# Patient Record
Sex: Female | Born: 1950 | Race: White | Hispanic: No | Marital: Married | Smoking: Never smoker
Health system: Southern US, Community
[De-identification: ages and names within clinical notes are randomized; demographics above are authoritative.]

## PROBLEM LIST (undated history)

## (undated) DIAGNOSIS — G8929 Other chronic pain: Secondary | ICD-10-CM

## (undated) DIAGNOSIS — M542 Cervicalgia: Secondary | ICD-10-CM

## (undated) DIAGNOSIS — M549 Dorsalgia, unspecified: Secondary | ICD-10-CM

## (undated) DIAGNOSIS — M797 Fibromyalgia: Secondary | ICD-10-CM

## (undated) HISTORY — DX: Cervicalgia: M54.2

## (undated) HISTORY — DX: Fibromyalgia: M79.7

## (undated) HISTORY — PX: OTHER SURGICAL HISTORY: SHX169

## (undated) HISTORY — PX: SHOULDER SURGERY: SHX246

## (undated) HISTORY — DX: Other chronic pain: G89.29

## (undated) HISTORY — PX: BACK SURGERY: SHX140

## (undated) HISTORY — DX: Dorsalgia, unspecified: M54.9

## (undated) HISTORY — PX: KNEE SURGERY: SHX244

---

## 1998-02-28 ENCOUNTER — Ambulatory Visit (HOSPITAL_COMMUNITY): Admission: RE | Admit: 1998-02-28 | Discharge: 1998-02-28 | Payer: Self-pay | Admitting: Neurosurgery

## 1998-03-31 ENCOUNTER — Encounter: Admission: RE | Admit: 1998-03-31 | Discharge: 1998-06-23 | Payer: Self-pay | Admitting: Anesthesiology

## 1998-06-15 ENCOUNTER — Encounter: Admission: RE | Admit: 1998-06-15 | Discharge: 1998-09-13 | Payer: Self-pay | Admitting: Anesthesiology

## 1998-06-23 ENCOUNTER — Encounter: Admission: RE | Admit: 1998-06-23 | Discharge: 1998-08-30 | Payer: Self-pay | Admitting: Anesthesiology

## 1998-08-22 ENCOUNTER — Encounter: Admission: RE | Admit: 1998-08-22 | Discharge: 1998-11-11 | Payer: Self-pay | Admitting: Anesthesiology

## 1998-11-11 ENCOUNTER — Encounter: Admission: RE | Admit: 1998-11-11 | Discharge: 1999-02-09 | Payer: Self-pay | Admitting: Anesthesiology

## 1998-12-06 ENCOUNTER — Other Ambulatory Visit: Admission: RE | Admit: 1998-12-06 | Discharge: 1998-12-06 | Payer: Self-pay | Admitting: Obstetrics and Gynecology

## 1999-02-15 ENCOUNTER — Encounter: Admission: RE | Admit: 1999-02-15 | Discharge: 1999-05-09 | Payer: Self-pay | Admitting: Anesthesiology

## 1999-05-08 ENCOUNTER — Encounter: Admission: RE | Admit: 1999-05-08 | Discharge: 1999-08-07 | Payer: Self-pay | Admitting: Anesthesiology

## 1999-10-02 ENCOUNTER — Encounter: Admission: RE | Admit: 1999-10-02 | Discharge: 1999-12-25 | Payer: Self-pay | Admitting: Anesthesiology

## 1999-12-25 ENCOUNTER — Encounter: Admission: RE | Admit: 1999-12-25 | Discharge: 2000-03-04 | Payer: Self-pay | Admitting: Anesthesiology

## 2000-01-11 ENCOUNTER — Other Ambulatory Visit: Admission: RE | Admit: 2000-01-11 | Discharge: 2000-01-11 | Payer: Self-pay | Admitting: Obstetrics and Gynecology

## 2000-03-18 ENCOUNTER — Encounter: Admission: RE | Admit: 2000-03-18 | Discharge: 2000-06-16 | Payer: Self-pay | Admitting: Anesthesiology

## 2000-07-12 ENCOUNTER — Encounter: Admission: RE | Admit: 2000-07-12 | Discharge: 2000-10-10 | Payer: Self-pay | Admitting: Anesthesiology

## 2000-11-19 ENCOUNTER — Encounter: Admission: RE | Admit: 2000-11-19 | Discharge: 2001-02-17 | Payer: Self-pay | Admitting: Anesthesiology

## 2001-01-27 ENCOUNTER — Other Ambulatory Visit: Admission: RE | Admit: 2001-01-27 | Discharge: 2001-01-27 | Payer: Self-pay | Admitting: Obstetrics and Gynecology

## 2001-02-18 ENCOUNTER — Encounter: Admission: RE | Admit: 2001-02-18 | Discharge: 2001-04-19 | Payer: Self-pay | Admitting: Anesthesiology

## 2001-05-27 ENCOUNTER — Encounter: Payer: Self-pay | Admitting: Gastroenterology

## 2001-05-27 ENCOUNTER — Ambulatory Visit (HOSPITAL_COMMUNITY): Admission: RE | Admit: 2001-05-27 | Discharge: 2001-05-27 | Payer: Self-pay | Admitting: Gastroenterology

## 2002-01-28 ENCOUNTER — Other Ambulatory Visit: Admission: RE | Admit: 2002-01-28 | Discharge: 2002-01-28 | Payer: Self-pay | Admitting: Obstetrics and Gynecology

## 2002-03-20 ENCOUNTER — Encounter (INDEPENDENT_AMBULATORY_CARE_PROVIDER_SITE_OTHER): Payer: Self-pay | Admitting: Specialist

## 2002-03-20 ENCOUNTER — Ambulatory Visit (HOSPITAL_BASED_OUTPATIENT_CLINIC_OR_DEPARTMENT_OTHER): Admission: RE | Admit: 2002-03-20 | Discharge: 2002-03-20 | Payer: Self-pay | Admitting: Urology

## 2003-01-19 ENCOUNTER — Ambulatory Visit (HOSPITAL_COMMUNITY): Admission: RE | Admit: 2003-01-19 | Discharge: 2003-01-19 | Payer: Self-pay | Admitting: Obstetrics and Gynecology

## 2003-01-19 ENCOUNTER — Encounter (INDEPENDENT_AMBULATORY_CARE_PROVIDER_SITE_OTHER): Payer: Self-pay

## 2004-02-03 ENCOUNTER — Other Ambulatory Visit: Admission: RE | Admit: 2004-02-03 | Discharge: 2004-02-03 | Payer: Self-pay | Admitting: Obstetrics and Gynecology

## 2004-02-23 ENCOUNTER — Encounter: Admission: RE | Admit: 2004-02-23 | Discharge: 2004-02-23 | Payer: Self-pay | Admitting: Gastroenterology

## 2004-03-07 ENCOUNTER — Encounter: Admission: RE | Admit: 2004-03-07 | Discharge: 2004-03-07 | Payer: Self-pay | Admitting: Gastroenterology

## 2005-02-07 ENCOUNTER — Other Ambulatory Visit: Admission: RE | Admit: 2005-02-07 | Discharge: 2005-02-07 | Payer: Self-pay | Admitting: Obstetrics and Gynecology

## 2008-01-23 ENCOUNTER — Encounter: Admission: RE | Admit: 2008-01-23 | Discharge: 2008-01-23 | Payer: Self-pay | Admitting: Gastroenterology

## 2008-03-18 ENCOUNTER — Encounter: Payer: Self-pay | Admitting: Gastroenterology

## 2011-01-05 NOTE — H&P (Signed)
[facility]  Patient:    Latoya Herring, Latoya Herring                        MRN: [id number] Adm. Date:  [id number] Attending:  Thyra Breed CC:         Osker Mason, M.D.             Julio Sicks, M.D.                         History and Physical  FOLLOW-UP EVALUATION:  Leaner comes in for follow-up evaluation of her chronic low back pain on the basis of degenerative disk disease and neck pain.   Neck pain and her lower back discomfort seem to have stabilized out nicely on the 40 mg b.i.d. of OxyContin in addition to her Neurontin and Vicodin.  She has not been able to reduce her Vicodin to any significant extent.  She rates her pain at 4 out of 10.  She complains predominantly of left-sided neck, shoulder, and shoulder blade discomfort.  She states it feels as though it is real tight in there.  She has applied heat, and it does help.  She has wrist discomfort bilaterally, which is worse at night.  If she does not sleep in the praying position, she notes that she has a lot of discomfort.  She is very worried about her disability and brings a letter that has been addressed to her with the need to address this.  MEDICATIONS:  Current medications are Neurontin 400 mg one p.o. q.6h., OxyContin 40 mg b.i.d., Accupril 20 mg per day, Paxil 20 mg per day, Mircette one per day, and Vicodin 5/500 one p.o. q.6h. x 3 during the day.  PHYSICAL EXAMINATION:  VITAL SIGNS:  Blood pressure is 141/99, heart rate is 91, respirations 12, O2 saturation is 98%, pain level is 4 out of 10, and temperature is 97.1.  MUSCULOSKELETAL/NEUROLOGIC:  Neck demonstrates good range of motion.  Deep tendon reflexes are symmetric in the upper and lower extremities with negative Spurling sign.  Motor is 5/5.  She has negative Phalens sign and Tinels sign at the wrist.  IMPRESSION: 1. Neck pain with very mild spondylosis on MRI. 2. Low back pain on the basis of degenerative disk disease with  chronic L5    radiculopathy. 3. Hypertension and hypertriglyceridemia per primary care physician. 4. Depression.  DISPOSITION: 1. Continue current medications with a prescription written for OxyContin    40 mg b.i.d., #60 with no refill, and Vicodin #90, one p.o. q.6h. p.r.n.    x 3 during the day. 2. Follow up with me in four to eight weeks. 3. Continue to walk daily. DD:  03/18/00 TD:  03/19/00 Job: [id number]

## 2011-01-05 NOTE — H&P (Signed)
[facility]  Patient:    Latoya Herring, Latoya Herring                        MRN: [id number] Adm. Date:  [id number] Attending:  Thyra Breed CC:         Latoya Herring, M.D.  Osker Mason, M.D.   History and Physical  FOLLOW-UP EVALUATION:  HISTORY OF PRESENT ILLNESS:  Latoya Herring comes in for a followup evaluation of her chronic L5 radiculopathy into the lower extremity and chronic neck discomfort. Since her last evaluation, the patient has continued to have a lot of neck discomfort and her lower extremity discomfort is not improved.  She apparently has had two failed attempts at social security disability and is getting somewhat frustrated by this, and is seriously contemplating getting an attorney.  I encouraged her to go ahead and do what she felt she needed to do to try and get that situated.  She notes that she has pain radiating out to the left shoulder, which is exacerbated by exercising or light work.  She has not been to physical therapy and has not gotten Latoya Herring on exercises for the neck.  EXAMINATION:  Blood pressure 187/84, heart rate is 96, respiratory rate is 18. O2 saturation is 99%.  Pain level is 5/10.  She exhibits pain on rotation of her neck to the left to about 45 degrees.  Deep tendon reflexes were symmetric in the upper and lower extremities.  Motor was 5/5.  IMPRESSION: 1. Neck pain, suspected soft tissue, myofacial pain type situation versus    shoulder problems, versus less likely cervical pathology within the joints    as magnetic resonance imaging has not been suggestive of this. 2. Low-back pain with underlying degenerative disk disease and chronic L5    radiculopathy, which continues to limit her activities significantly. 3. Other medical problems per Dr. Latricia Herring, her primary care physician.  DISPOSITION: 1. Continue Neurontin at 600 mg q.6h.  She does not need a prescription    for this today. 2. OxyContin 40 mg one p.o.  b.i.d., #60 with no refill. 3. Vicodin 5/500 one p.o. q.6h. p.r.n. breakthrough pain, #90 with no refill. 4. She was encouraged to go ahead and get Dr. Beverely Herring book on the    neck. 5. Followup with me in three months.  She will let us know when she needs    prescriptions. DD:  08/21/00 TD:  08/21/00 Job: [id number]

## 2011-01-05 NOTE — H&P (Signed)
[facility]  Patient:    Latoya Herring, Latoya Herring                        MRN: [id number] Adm. Date:  [id number] Attending:  Thyra Breed CC:         ____________, Wyn Forster, [place]                         History and Physical  FOLLOWUP EVALUATION:  Jyssica comes in for followup evaluation of her neck pain and lower back pain.  Since her last evaluation, the patient has noted that her neck discomfort has been the worst.  She has a chronic pain in the left side of her neck and with certain movements, she will get the pain out into the right side.  It comes and goes and is exacerbated by movement from side-to-side.  She does not feel that a lot helps her neck.  Her lower back discomfort is most intensive if she stays in one position for any period of time.  If she sits for more than 10 minutes, stands for any longer than 5 to 10 minutes or lies in any position except on her right side, she has progressively intensifying severe pain which limits her.  She has tried to exercise and walk as much as she can but she can only do light housework.  CURRENT MEDICATIONS: 1. Vicodin, which she takes three times a day. 2. OxyContin 40 mg twice a day. 3. Neurontin 600 mg q.6h. 4. Accupril. 5. She has also taken some Vioxx which she notes helps.  She has developed some abdominal discomfort which she states is reminiscent of when she has exacerbations of her diverticulitis and she has seen Dr. Petra Kuba in the past for this and I encouraged her to consider going ahead and having him reevaluate her if she is having problems.  She saw her primary care physician, ______  in [place], very recently.  He apparently checked her thyroid and she does not know the results of these evaluations.  PHYSICAL EXAMINATION:  VITAL SIGNS:  Blood pressure 137/91.  Heart rate is 94.  Respiratory rate is 18.  O2 saturation is 99%.  Pain level is 4/10.  NECK:  She demonstrates pain on range of  motion of her neck.  NEUROLOGIC:  Deep tendon reflexes were symmetric in the upper and lower extremities.  She has increased pain of her lower back on hyperextension to about 20 degrees.  IMPRESSION: 1. Low back pain on the basis of degenerative disk disease with chronic L5    radiculopathy. 2. Neck pain, predominantly myofascial in etiology.  I suspect she has early    degenerative changes, but these were not evident on her last MRI. 3. Abdominal discomfort for which she plans to see Dr. Ewing Schlein. 4. Other medical problems per ______  and ______ , her primary care    physicians.  DISPOSITION: 1. Continue on Neurontin at current dose. 2. OxyContin 40 mg 1 p.o. b.i.d., #60 with no refill. 3. Vicodin 5/500, 1 p.o. q.6h. p.r.n. x 3 during the day, #90 with no refill. 4. Continue to exercise. 5. Follow up with me in three months. DD:  11/20/00 TD:  11/20/00 Job: [id number]

## 2011-01-05 NOTE — H&P (Signed)
[facility]  Patient:    Latoya Herring, Latoya Herring                        MRN: [id number] Adm. Date:  [id number] Attending:  Thyra Breed CC:         Dr. Latricia Heft, [place], [place]  Dr. Julio Sicks   History and Physical  FOLLOWUP EVALUATION:  Latoya Herring comes in for followup evaluation of her chronic low back pain on the basis of spondylosis.  Since her last evaluation, she states that she has been very tolerant to that discomfort.  Her biggest discomfort is neck pain, radiating out into the left upper extremity and shoulder.  At times when she turns her head, she has a lightning, electrical-type discomfort radiating into the occipital region and out into the top of the shoulder but does not go out into the arm.  She has noted that the pain is so severe that it keeps her from sleeping well at night.  It is more a deep, dull, achy discomfort.  She notes that certain positions tend to exacerbate it and she is struggling to find something that reduces the discomfort.  We have done MRIs which showed minimal changes in her neck in the past.  I advised her that it may be more of a shoulder or soft tissue-type problem and we needed to consider physical therapy.  She is concerned that her insurance may not cover physical therapy.  She continues on OxyContin, Neurontin and Vicodin.  EXAMINATION  VITAL SIGNS:  Blood pressure 138/90, heart rate is 91, respiratory rate is 20, O2 saturation is 99% and pain level is 6/10.  NEUROMUSCULAR:  Range of motion of her neck is intact with some tenderness over the left occipital region and left paraspinous muscles.  Range of motion of her shoulders was intact but resisted internal/external rotation increased discomfort in her shoulder.  Motor in the upper extremities was 5/5. Sensation to pinprick was intact.  Deep tendon reflexes were symmetric.  Lower extremities demonstrated symmetric deep tendon reflexes with negative straight leg  raise signs.  IMPRESSION 1. Neck pain with radiation out into the left shoulder; suspect soft tissue    etiology such as a trigger point versus nerve root irritation which is not    evident on the MRI versus intrinsic shoulder pain. 2. Low back pain on the basis of degenerative disk disease with chronic L5    radiculopathy. 3. Hypertension and hypertriglyceridemia per Dr. Latricia Heft in [place].  DISPOSITION 1. Continue the Neurontin. 2. Continue on OxyContin 40 mg 1 p.o. b.i.d., #60 with no refill. 3. Continue Vicodin 5/500 1 p.o. q.8h. p.r.n., #90 with no refill. 4. She was given the title of ______ book on the neck to see whether this    will be of any benefit and a prescription for physical therapy to see    whether this will help her at all. 5. Follow up with me in four weeks.  If she is not improving in four weeks,    will consider having her evaluated by a neurosurgeon to ascertain whether    they feel there is any further inputs that might be of any benefit. DD:  07/15/00 TD:  07/15/00 Job: [id number]

## 2011-01-05 NOTE — H&P (Signed)
[facility]  Patient:    Latoya Herring, Latoya Herring                        MRN: [id number] Adm. Date:  [id number] Attending:  Thyra Breed CC:         Dr. Latricia Heft, [place], [place]   History and Physical  FOLLOWUP EVALUATION:  The patient comes in for followup evaluation today.  She complains predominantly of her neck discomfort, predominantly right greater than left, with a sense of swelling over the base of the skull on the left side.  She complains of upper arm achiness, especially in the evenings.  She has had no significant change in numbness or tingling.  She has had a flare-up of her low back discomfort which put her in bed for about a week since her last visit.  She continues to note that she cannot sit for longer than 10 minutes, stand for longer than 5 to 10 minutes without changing position and cannot lift anything over about 15 pounds without a great deal of discomfort.  She presents with some disability papers that needed to be filled out and I advised her that we usually do not do these because they ask specifics about weight-lifting, etc, which we just do not quantitate in our practice.  She has asked me to try to fill these out as best we can and I advised her I would fill them out to the extent that I could but that I cannot fill out parts of the form that he has requested; she understands this.  She continues on her Vicodin and OxyContin as well as Neurontin and Vioxx and feels they are helping; she also take Accupril through Dr. Latricia Heft.  PHYSICAL EXAMINATION:  VITAL SIGNS:  Blood pressure 151/95.  Heart rate is 90.  Respiratory rate is 20.  O2 saturation is 99%.  Pain level is 6/10.  NECK:  Patient demonstrates pain on lateral flexion of the neck to 45 degrees with intact rotation.  NEUROLOGIC:  Deep tendon reflexes were symmetric in the upper extremities. Motor was 5/5.  There were no sensory deficits appreciated.  Lower  extremities demonstrated deep tendon reflexes symmetric at the knees and ankles.   IMPRESSION: 1. Low back pain on the basis of degenerative disk disease with a chronic    L5 radiculopathy. 2. Neck pain with tenderness over the left occipital groove. 3. Abdominal discomfort per Dr. Petra Kuba. 4. Other medical problems per Dr. Latricia Heft.  DISPOSITION: 1. Prescriptions were written for OxyContin 40 mg 1 p.o. b.i.d., #60, and    Vicodin 5/500 mg 1 p.o. q.6h. x 3 during the day, #90, both with no    refills. 2. I went ahead and injected into the left greater occipital groove with    1.5 cc of local anesthetic consisting of 1% lidocaine mixed with    0.5% levobupivacaine.  The patient noted that the left neck region improved    with the result of this. 3. Continue with attempts to build up her strength with exercise. 4. Follow up with me in three months.  ADDENDUM:  The patients husband had a heart attack in April and she has felt a considerable amount of pressure with his care. DD:  02/19/01 TD:  02/19/01 Job: [id number]

## 2011-01-05 NOTE — Op Note (Signed)
TNAMEEndya, Latoya NO.:  Herring   MEDICAL RECORD NO.:  [id number]                    PATIENT TYPE:   LOCATION:                                       FACILITY:   PHYSICIAN:  Latoya Herring, M.D.               DATE OF BIRTH:   DATE OF PROCEDURE:  03/20/2002  DATE OF DISCHARGE:                                 OPERATIVE REPORT   PREOPERATIVE DIAGNOSES:  1. Chronic pelvic pain.  2. Possible interstitial cystitis.   POSTOPERATIVE DIAGNOSES:  1. Chronic pelvic pain.  2. Possible interstitial cystitis.   PROCEDURES:  1. Cystoscopy.  2. Urethral calibration.  3. Hydrodistention of the bladder.  4. Bladder biopsy.  5. Marcaine and Pyridium instillation.  6. Marcaine and Kenalog injection.  7. Bilateral retrograde studies.   SURGEON:  Latoya Herring, M.D.   ANESTHESIA:  General.   COMPLICATIONS:  None.   DRAINS:  None.   HISTORY:  This 60 year old female has had problems with chronic lower  urinary tract symptoms felt in the past to be possibly due to chronic  urinary tract infections.  The patient has taken multiple antibiotics,  including __________ antibiotics, without improvement.  I reviewed the  records from Dr. Enos Fling office, which showed at least seven negative  urinalyses with no real response to antibiotic therapy, but on one occasion  I did find a urinalysis that was abnormal and one positive culture growing  1000 colonies.  Under these circumstances I think it is very possible that  the patient could have interstitial cystitis simply given the fact that she  has known irritable bowel syndrome as well as the possibility of  fibromyalgia.  The patient's pain is severe enough that she is taking  OxyContin 40 mg, although that is being given primarily for back and neck  pain.  She has been under long-standing care of  Henry A. Jordan Likes, M.D., Petra Kuba, M.D., and Janeece Riggers. Dareen Piano, M.D.  The patient is interested in  being  evaluated for an interstitial cystitis.  She was given the opportunity  of a potassium test or cystoscopy and for that reason elected to undergo  cystoscopy under anesthesia.  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  This is based on retrograde evaluation by Ky Barban, M.D., in  [place].  For that reason the patient will undergo retrograde studies at  the time of her hydrodistention.  The patient understands the risks and  benefits of the procedure, including the fact that there is no guarantee she  will have any improvement in her symptoms and certainly is aware of the fact  that she will have increasing pain for the next week or so based on the  procedure itself.  She gave full and informed consent.  DESCRIPTION OF PROCEDURE:  After successful induction of general anesthesia  with the patient placed in the dorsal lithotomy position, prepped with  Betadine, and draped in the usual sterile fashion, careful bimanual  examination revealed no evidence of cystocele, rectocele, or enterocele.  There were no masses on bimanual exam.  The urethra was of normal caliber.  There was no evidence of diverticulum.  The urethra was calibrated up to 78  [place] with no evidence of stenosis or stricture.  The cystoscope was  inserted and the bladder was carefully inspected.  It was free of any tumors  or stones.  Both ureteral orifices were normal in configuration and  location.  The two ureters were seen to efflux clear urine.  Retrograde  studies were performed on each side and showed no evidence of atresia or  other abnormalities of the ureter, indicating that whatever was seen by Dr.  Jerre Simon does not appear to be present at this time.  Hydrodistention was then  performed, and the bladder was filled for five minutes to a pressure of 100  cmH2O.  When the bladder was drained, glomerulations could be seen   throughout the bladder, but the patient had a nearly normal bladder capacity  of just under 1000 cc with normal being 1150.  The patient had a bladder  biopsy.  The biopsy site was cauterized.  A mixture of Marcaine and Pyridium  was left in the bladder, and Marcaine and Kenalog were injected  periurethrally.  The patient had a B&O suppository inserted and was given  intraoperative Toradol and Zofran.  The patient will continue on her pain  medications.  She will be given a prescription for Pyridium Plus and  Levaquin.  We will make a decision as to the optimal therapy based on the  results of the biopsy as well as the determination of how well she does with  the hydrodistention itself.  Her follow-up will be in two or three weeks'  time.                                                Latoya Herring, M.D.    RJE/MEDQ  D:  03/20/2002  T:  03/26/2002  Job:  [id number]   cc:   Petra Kuba, M.D.   Henry A. Pool, M.D.  [address]  [place]  [place] [address]  Fax: [phone]   Janeece Riggers. Dareen Piano, M.D.

## 2011-01-05 NOTE — Op Note (Signed)
   NAME:  Latoya Herring, Latoya Herring                           ACCOUNT NO.:  [id number]   MEDICAL RECORD NO.:  [id number]                   PATIENT TYPE:  AMB   LOCATION:  SDC                                  FACILITY:  [facility]   PHYSICIAN:  Malva Limes, M.D.                 DATE OF BIRTH:  [birth year]   DATE OF PROCEDURE:  01/19/2003  DATE OF DISCHARGE:                                 OPERATIVE REPORT   PREOPERATIVE DIAGNOSES:  1. Endometrial hyperplasia.  2. Postmenopausal bleeding on estrogen replacement therapy.   POSTOPERATIVE DIAGNOSES:  1. Endometrial hyperplasia.  2. Postmenopausal bleeding on estrogen replacement therapy.   PROCEDURES:  1. Dilatation and curettage.  2. Hysteroscopy.   SURGEON:  Malva Limes, M.D.   ANESTHESIA:  General anesthesia.   ANTIBIOTICS:  Ancef 1 g.   ESTIMATED BLOOD LOSS:  Minimal.   SPECIMENS:  Endometrial curettings sent to pathology.   COMPLICATIONS:  None.   DRAINS:  Red rubber catheter to the bladder.   DESCRIPTION OF PROCEDURE:  The patient was taken to the operating room where  she was place din the dorsal supine position. A general anesthetic was  administered without complications.  She was then placed in the dorsal  lithotomy position and prepped with Hibiclens.  The bladder was drained with  red rubber catheter.  She was draped in the usual fashion for this  procedure.  A sterile speculum was placed in the vagina.  Single-tooth  tenaculum was applied to the anterior cervical lip. The cervical os was then  dilated to a 31 [place].  The uterus was sounded to 8 cm.  The hysteroscope  was advanced through the endocervical canal which appeared to be normal.  On  entering the uterine cavity, there was no evidence of any polyps or  fibroids.  The ostia were both visualized with ease.  No lesions or  abnormalities were seen.  At this point, the hysteroscope was then removed  and sharp curettage was  performed with specimens sent to pathology.   The patient tolerated the  procedure well. She was taken to the recovery room in stable condition.  Instrument and lap counts were correct x1.  The patient will be discharged  to home.  She will be instructed to follow up in the office in four weeks.                                               Malva Limes, M.D.    MA/MEDQ  D:  01/19/2003  T:  01/19/2003  Job:  [id number]

## 2011-01-05 NOTE — H&P (Signed)
[facility]  Patient:    Latoya Herring, Latoya Herring                        MRN: [id number] Adm. Date:  [id number] Attending:  Thyra Breed CC:         Julio Sicks, M.D.   History and Physical  FOLLOW-UP EVALUATION:  [name] comes in for follow-up evaluation of her chronic low back pain and neck discomfort.  Since her last evaluation, she complains more of neck discomfort and feels that she has had a headache for about the past two weeks.  She has also had some sinus drainage.  Her lower back discomfort seems to be fairly well-stabilized on her current regimen of OxyContin 40 mg twice a day with Neurontin and Vicodin for breakthrough pain, although she has developed some irritation on the lateral aspect of her last thigh, which feels as though there is more pins and needles.  She continues to rate her pain at 4 out of 10.  PHYSICAL EXAMINATION:  VITAL SIGNS:  Blood pressure 143/83, heart rate 77, respiratory rate 12, O2 saturation 97%, pain level is 4 out of 10, and temperature is 98.7.  NEUROLOGIC:  Deep tendon reflexes were symmetric in the lower extremities and upper extremities with negative straight leg raise signs.  She demonstrated no change in range of motion from previously.  IMPRESSION: 1. Neck pain with very mild spondylosis. 2. Low back pain with history of degenerative disk disease and chronic L5    radiculopathy. 3. Hypertension, hypertriglyceridemia, and hypertension per primary care    physician.  DISPOSITION: 1. Continue Neurontin but increase to 600 mg one p.o. q.6h., #120 with six    refills. 2. OxyContin 40 mg one p.o. b.i.d., #60 with no refills. 3. Vicodin 5/500 one p.o. q.6h. p.r.n. breakthrough pain, #90 with no refills. 4. Follow up with me in eight weeks. DD:  05/20/00 TD:  05/20/00 Job: [id number]

## 2011-01-05 NOTE — H&P (Signed)
[facility]  Patient:    Latoya Herring, Latoya Herring                        MRN: [id number] Adm. Date:  [id number] Attending:  Thyra Breed CC:         Jearld Adjutant, M.D.             Dr. Latricia Heft                         History and Physical  FOLLOW-UP EVALUATION  HISTORY OF PRESENT ILLNESS:  Eliora Nienhuis comes in for for further evaluation of her low back pain and neck pain.  The patient states that she has had a rather difficult time with things since her last visit.  She is beginning to get very discouraged with regard to her neck discomfort radiating out into the left shoulder.  She is also discouraged about the fact that her back pain keeps persisting.  She is getting teary-eyed at times.  She stated that she is willing to go on an antidepressant if needed.  She continues on the OxyContin 20 mg three times a day and Vicodin 5/500 which is helping it was her lower back pain.  She has had an MRI performed at [facility], which I looked at briefly, and it does look as though there is some spondylosis on this.  We are awaiting the official interpretation of it.  The patient rates her neck pain at 7/10 and her lower back pain at 2/10.  She notes that moving about helps her loosen her back.  Sleeping with her arms extended will result in numbness and tingling in both hands at the fourth and fifth fingers.  Moving her head from side to side will increase her neck discomfort.  The patient has not been able to tolerate the Celebrex and is not taking this. She does not take the Vioxx.  She has recently had a urinary tract infection and has been treated by Dr. Dimple Casey.  PHYSICAL EXAMINATION:  Blood pressure 132/72, heart rate 84, respiratory rate 12, O2 saturation 98%.  Pain level is 7/10.  Temperature is 97.7.  The patient demonstrated mild restrictive range of motion of the neck with rotation to the left to about 45-50 degrees.  There is tenderness over the upper  cervical facet regions.  She has a negative Spurlings sign.  Deep tendon reflexes were symmetric in the upper and lower extremities.  Motor is 5/5.  IMPRESSION: 1. Neck discomfort on the basis of cervical spondylosis. 2. Low back pain on the basis of degenerative disk disease with chronic L5    radiculopathy. 3. Hypertension and hypertriglyceridemia per primary care physician. 4. Depression, most likely reactive to #1 and #2.  INSTRUCTIONS: 1. Trial of Paxil 20 mg 1 p.o. q.d., #30 with two refills.  The patient was    advised of the side effects and was encouraged to call if she was not    tolerating it well. 2. Increase OxyContin to 40 mg 1 p.o. b.i.d., #60 with no refills. 3. Continue Vicodin 5/500 1 p.o. q.6h. x 3 during the day for breakthrough    pain, #90 with no refills. 4. We will have the official interpretation of her MRI forwarded to [place], and    we will contact her with the results. 5. Follow up with me in four weeks. DD:  02/19/00 TD:  02/19/00 Job: [id number]

## 2016-08-15 ENCOUNTER — Other Ambulatory Visit: Payer: Self-pay | Admitting: Obstetrics and Gynecology

## 2016-08-15 DIAGNOSIS — R928 Other abnormal and inconclusive findings on diagnostic imaging of breast: Secondary | ICD-10-CM

## 2016-08-22 ENCOUNTER — Other Ambulatory Visit: Payer: Self-pay

## 2016-08-30 ENCOUNTER — Ambulatory Visit
Admission: RE | Admit: 2016-08-30 | Discharge: 2016-08-30 | Disposition: A | Discharge status: Home / Self Care | Payer: Medicare Other | Source: Ambulatory Visit | Attending: Obstetrics and Gynecology | Admitting: Obstetrics and Gynecology

## 2016-08-30 DIAGNOSIS — R928 Other abnormal and inconclusive findings on diagnostic imaging of breast: Secondary | ICD-10-CM

## 2016-11-19 ENCOUNTER — Emergency Department (HOSPITAL_COMMUNITY): Payer: Medicare Other

## 2016-11-19 ENCOUNTER — Encounter (HOSPITAL_COMMUNITY): Payer: Self-pay

## 2016-11-19 ENCOUNTER — Emergency Department (HOSPITAL_COMMUNITY)
Admission: EM | Admit: 2016-11-19 | Discharge: 2016-11-19 | Disposition: A | Discharge status: Home / Self Care | Payer: Medicare Other | Attending: Emergency Medicine | Admitting: Emergency Medicine

## 2016-11-19 DIAGNOSIS — N390 Urinary tract infection, site not specified: Secondary | ICD-10-CM | POA: Diagnosis not present

## 2016-11-19 DIAGNOSIS — F19232 Other psychoactive substance dependence with withdrawal with perceptual disturbance: Secondary | ICD-10-CM | POA: Diagnosis not present

## 2016-11-19 DIAGNOSIS — R1011 Right upper quadrant pain: Secondary | ICD-10-CM | POA: Diagnosis not present

## 2016-11-19 DIAGNOSIS — Z79899 Other long term (current) drug therapy: Secondary | ICD-10-CM | POA: Insufficient documentation

## 2016-11-19 DIAGNOSIS — R109 Unspecified abdominal pain: Secondary | ICD-10-CM | POA: Diagnosis present

## 2016-11-19 DIAGNOSIS — T43205A Adverse effect of unspecified antidepressants, initial encounter: Secondary | ICD-10-CM

## 2016-11-19 DIAGNOSIS — R1010 Upper abdominal pain, unspecified: Secondary | ICD-10-CM

## 2016-11-19 LAB — URINALYSIS, ROUTINE W REFLEX MICROSCOPIC
BILIRUBIN URINE: NEGATIVE
Ketones, ur: 20 mg/dL — AB
NITRITE: NEGATIVE
PH: 7 (ref 5.0–8.0)
Protein, ur: 100 mg/dL — AB
SPECIFIC GRAVITY, URINE: 1.015 (ref 1.005–1.030)

## 2016-11-19 LAB — CBC WITH DIFFERENTIAL/PLATELET
BASOS ABS: 0 10*3/uL (ref 0.0–0.1)
BASOS PCT: 0 %
EOS ABS: 0.1 10*3/uL (ref 0.0–0.7)
Eosinophils Relative: 1 %
HCT: 47.7 % — ABNORMAL HIGH (ref 36.0–46.0)
HEMOGLOBIN: 16.1 g/dL — AB (ref 12.0–15.0)
Lymphocytes Relative: 19 %
Lymphs Abs: 2.5 10*3/uL (ref 0.7–4.0)
MCH: 29.1 pg (ref 26.0–34.0)
MCHC: 33.8 g/dL (ref 30.0–36.0)
MCV: 86.1 fL (ref 78.0–100.0)
MONOS PCT: 9 %
Monocytes Absolute: 1.2 10*3/uL — ABNORMAL HIGH (ref 0.1–1.0)
NEUTROS ABS: 9.5 10*3/uL — AB (ref 1.7–7.7)
NEUTROS PCT: 71 %
Platelets: 377 10*3/uL (ref 150–400)
RBC: 5.54 MIL/uL — AB (ref 3.87–5.11)
RDW: 13.2 % (ref 11.5–15.5)
WBC: 13.3 10*3/uL — AB (ref 4.0–10.5)

## 2016-11-19 LAB — LIPASE, BLOOD: LIPASE: 32 U/L (ref 11–51)

## 2016-11-19 LAB — COMPREHENSIVE METABOLIC PANEL
ALK PHOS: 106 U/L (ref 38–126)
ALT: 55 U/L — ABNORMAL HIGH (ref 14–54)
AST: 52 U/L — ABNORMAL HIGH (ref 15–41)
Albumin: 4.5 g/dL (ref 3.5–5.0)
Anion gap: 13 (ref 5–15)
BILIRUBIN TOTAL: 2.1 mg/dL — AB (ref 0.3–1.2)
BUN: 13 mg/dL (ref 6–20)
CALCIUM: 10.2 mg/dL (ref 8.9–10.3)
CO2: 24 mmol/L (ref 22–32)
Chloride: 103 mmol/L (ref 101–111)
Creatinine, Ser: 0.97 mg/dL (ref 0.44–1.00)
GFR, EST NON AFRICAN AMERICAN: 60 mL/min — AB (ref >60)
Glucose, Bld: 126 mg/dL — ABNORMAL HIGH (ref 65–99)
POTASSIUM: 3.1 mmol/L — AB (ref 3.5–5.1)
Sodium: 140 mmol/L (ref 135–145)
TOTAL PROTEIN: 7.7 g/dL (ref 6.5–8.1)

## 2016-11-19 LAB — MAGNESIUM: MAGNESIUM: 2 mg/dL (ref 1.7–2.4)

## 2016-11-19 MED ORDER — POTASSIUM CHLORIDE CRYS ER 20 MEQ PO TBCR
40.0000 meq | EXTENDED_RELEASE_TABLET | Freq: Once | ORAL | Status: AC
  Administered 2016-11-19: 40 meq via ORAL
  Filled 2016-11-19: qty 2

## 2016-11-19 MED ORDER — PANTOPRAZOLE SODIUM 20 MG PO TBEC: 20.0000 mg | DELAYED_RELEASE_TABLET | Freq: Every day | ORAL | 0 refills | Status: DC

## 2016-11-19 MED ORDER — KETOROLAC TROMETHAMINE 30 MG/ML IJ SOLN
15.0000 mg | Freq: Once | INTRAMUSCULAR | Status: AC
  Administered 2016-11-19: 15 mg via INTRAVENOUS
  Filled 2016-11-19: qty 1

## 2016-11-19 MED ORDER — SODIUM CHLORIDE 0.9 % IV BOLUS (SEPSIS)
1000.0000 mL | Freq: Once | INTRAVENOUS | Status: AC
  Administered 2016-11-19: 1000 mL via INTRAVENOUS

## 2016-11-19 MED ORDER — DEXTROSE 5 % IV SOLN
1.0000 g | Freq: Once | INTRAVENOUS | Status: AC
  Administered 2016-11-19: 1 g via INTRAVENOUS
  Filled 2016-11-19: qty 10

## 2016-11-19 MED ORDER — PAROXETINE HCL 30 MG PO TABS: 30.0000 mg | ORAL_TABLET | Freq: Every day | ORAL | 0 refills | Status: DC

## 2016-11-19 MED ORDER — CEPHALEXIN 500 MG PO CAPS: 500.0000 mg | ORAL_CAPSULE | Freq: Two times a day (BID) | ORAL | 0 refills | Status: AC

## 2016-11-19 MED ORDER — ONDANSETRON 4 MG PO TBDP: 4.0000 mg | ORAL_TABLET | Freq: Three times a day (TID) | ORAL | 0 refills | Status: DC | PRN

## 2016-11-19 NOTE — ED Provider Notes (Signed)
[facility]-EMER[facility]ENCY DEPT Provider Note   CSN: [id number] Arrival date & time: 11/19/16  0843     History   Chief Complaint Chief Complaint  Patient presents with  . Medication Reaction    HPI Latoya Herring is a 66 y.o. female.  HPI  66 year old female with a history of chronic neck and back pain as well as depression presents with multiple symptoms. She states she feels like she is withdrawing from all her medicines. She states that she was started on Xtampza 2 months ago. Since then she has been having multiple symptoms including electric shocks that her body, diarrhea, staring at walls, depression, both ears feel full of fluid, and weight loss. Feels like there is a burnt taste in her mouth. She states that she stopped taking her Paxil because her body "rejected everything". She denies that this means vomiting, but cannot further clarify what this means. She states that the medicine change was to try get a more generic OxyContin. She states she has not been on narcotics during this time otherwise. She still takes Xanax intermittently. Has tried taking Paxil again but states that her body keeps rejecting it. Does not feel any focal weakness but feels numbness diffusely, left worse than right. Feels like the medicines "tear my stomach up". Feels like she's irritated when urinating, denies burning. Feels like she's getting a UTI. Has seen her pain specialist, primary MD, and 2 ER visits in [place] during these 2 months.  No past medical history on file.  There are no active problems to display for this patient.   Past Surgical History:  Procedure Laterality Date  . BACK SURGERY      OB History    No data available       Home Medications    Prior to Admission medications   Medication Sig Start Date End Date Taking? Authorizing Provider  vitamin C (ASCORBIC ACID) 500 MG tablet Take 500 mg by mouth daily.   Yes Historical Provider, MD  vitamin E 100 UNIT capsule Take 100 Units by mouth  daily.   Yes Historical Provider, MD  cephALEXin (KEFLEX) 500 MG capsule Take 1 capsule (500 mg total) by mouth 2 (two) times daily. 11/19/16 11/24/16  Pricilla Loveless, MD  ondansetron (ZOFRAN ODT) 4 MG disintegrating tablet Take 1 tablet (4 mg total) by mouth every 8 (eight) hours as needed for nausea or vomiting. 11/19/16   Pricilla Loveless, MD  pantoprazole (PROTONIX) 20 MG tablet Take 1 tablet (20 mg total) by mouth daily. 11/19/16   Pricilla Loveless, MD  PARoxetine (PAXIL) 30 MG tablet Take 1 tablet (30 mg total) by mouth daily. 11/19/16   Pricilla Loveless, MD    Family History No family history on file.  Social History Social History  Substance Use Topics  . Smoking status: Never Smoker  . Smokeless tobacco: Never Used  . Alcohol use No     Allergies   Penicillins   Review of Systems Review of Systems  Constitutional: Positive for appetite change and unexpected weight change.  Respiratory: Negative for shortness of breath.   Cardiovascular: Negative for chest pain.  Gastrointestinal: Positive for abdominal pain and diarrhea. Negative for vomiting.  Genitourinary: Positive for dysuria.  Musculoskeletal: Positive for back pain (chronic) and neck pain (chronic).  Neurological: Positive for weakness and numbness.  Psychiatric/Behavioral: Positive for dysphoric mood.  All other systems reviewed and are negative.    Physical Exam Updated Vital Signs BP 139/80   Pulse 90   Temp  98.1 F (36.7 C) (Oral)   Resp 15   Ht  (1.702 m)   Wt 193 lb (87.5 kg)   SpO2 100%   BMI 30.23 kg/m   Physical Exam  Constitutional: She is oriented to person, place, and time. She appears well-developed and well-nourished.  disheveled  HENT:  Head: Normocephalic and atraumatic.  Right Ear: External ear normal.  Left Ear: External ear normal.  Nose: Nose normal.  Mouth/Throat: Oropharynx is clear and moist.  Eyes: EOM are normal. Pupils are equal, round, and reactive to light. Right eye exhibits  no discharge. Left eye exhibits no discharge.  Neck: Neck supple.  Cardiovascular: Regular rhythm and normal heart sounds.  Tachycardia present.   Pulmonary/Chest: Effort normal and breath sounds normal.  Abdominal: Soft. She exhibits no distension. There is no tenderness.  Neurological: She is alert and oriented to person, place, and time.  CN 3-12 grossly intact. 5/5 strength in all 4 extremities. She feels diffuse decreased sensation to light touch. Normal finger to nose.   Skin: Skin is warm and dry.  Nursing note and vitals reviewed.    ED Treatments / Results  Labs (all labs ordered are listed, but only abnormal results are displayed) Labs Reviewed  COMPREHENSIVE METABOLIC PANEL - Abnormal; Notable for the following:       Result Value   Potassium 3.1 (*)    Glucose, Bld 126 (*)    AST 52 (*)    ALT 55 (*)    Total Bilirubin 2.1 (*)    GFR calc non Af Amer 60 (*)    All other components within normal limits  CBC WITH DIFFERENTIAL/PLATELET - Abnormal; Notable for the following:    WBC 13.3 (*)    RBC 5.54 (*)    Hemoglobin 16.1 (*)    HCT 47.7 (*)    Neutro Abs 9.5 (*)    Monocytes Absolute 1.2 (*)    All other components within normal limits  URINALYSIS, ROUTINE W REFLEX MICROSCOPIC - Abnormal; Notable for the following:    APPearance CLOUDY (*)    Glucose, UA >=500 (*)    Hgb urine dipstick SMALL (*)    Ketones, ur 20 (*)    Protein, ur 100 (*)    Leukocytes, UA LARGE (*)    Bacteria, UA FEW (*)    Squamous Epithelial / LPF 6-30 (*)    All other components within normal limits  URINE CULTURE  LIPASE, BLOOD  MAGNESIUM    EKG  EKG Interpretation  Date/Time:  Monday November 19 2016 09:37:27 EDT Ventricular Rate:  107 PR Interval:    QRS Duration: 98 QT Interval:  324 QTC Calculation: 433 R Axis:   -51 Text Interpretation:  Sinus tachycardia Left anterior fascicular block Abnormal R-wave progression, late transition Baseline wander in lead(s) V1 rate is  faster compared to 2003 Confirmed by Ryden Wainer MD, Jaaron Oleson [phone]) on 11/19/2016 9:57:33 AM       Radiology US Abdomen Limited Ruq  Result Date: 11/19/2016 CLINICAL DATA:  Right upper quadrant pain EXAM: US ABDOMEN LIMITED - RIGHT UPPER QUADRANT COMPARISON:  None. FINDINGS: Gallbladder: No gallstones or wall thickening visualized. No sonographic Murphy sign noted by sonographer. Common bile duct: Diameter: Focal CBD dilatation measuring 9 mm with the overall CBD measuring 3 mm. Liver: 10 x 8 x 12 mm hypoechoic mass with increased through transmission in the left hepatic lobe likely reflecting a small cyst. Within normal limits in parenchymal echogenicity. IMPRESSION: No cholelithiasis or sonographic  evidence of acute cholecystitis. Electronically Signed   By: Elige Ko   On: 11/19/2016 11:40    Procedures Procedures (including critical care time)  Medications Ordered in ED Medications  sodium chloride 0.9 % bolus 1,000 mL (0 mLs Intravenous Stopped 11/19/16 1057)  potassium chloride SA (K-DUR,KLOR-CON) CR tablet 40 mEq (40 mEq Oral Given 11/19/16 1054)  ketorolac (TORADOL) 30 MG/ML injection 15 mg (15 mg Intravenous Given 11/19/16 1054)  sodium chloride 0.9 % bolus 1,000 mL (0 mLs Intravenous Stopped 11/19/16 1318)  cefTRIAXone (ROCEPHIN) 1 g in dextrose 5 % 50 mL IVPB (0 g Intravenous Stopped 11/19/16 1319)     Initial Impression / Assessment and Plan / ED Course  I have reviewed the triage vital signs and the nursing notes.  Pertinent labs & imaging results that were available during my care of the patient were reviewed by me and considered in my medical decision making (see chart for details).  Clinical Course as of Nov 19 1898  Mon Nov 19, 2016  4098 Based on symptoms, this sounds like withdrawal from Paxil. However she does not want to restart it, states she has tried this and it doesn't work. There is no acute neurologic deficit on my exam. Will screen with electrolytes, TSH, give fluids.   [SG]  1042 Patient's labs show a mild LFT elevation and mild hyperbilirubinemia. Also appears to have a urinary tract infection based on symptoms. Given the LFT abnormality will get ultrasound as she endorses that she has a hard time eating because of abdominal pain. Will also start on antibiotics for the UTI.  [SG]  1102 Patient is allergic to penicillins. When asked what her allergy is, she states that it "always makes everything worse". However she has never had shortness of breath, rash, throat closing sensation, etc. This does not sound like a true allergy and I think cephalosporins would be okay for her UTI.  [SG]    Clinical Course User Index [SG] Pricilla Loveless, MD    Her primary subacute symptoms for 2 months seem c/w paxil withdrawal. Last effective dose was 30 mg, I have recommended she go back on this. Upper abd pain likely gastritis/GERD given relation to food. No cholecystitis. UTI but this does not appear consistent with more severe disease. She is feeling better. She has not taken her narcotics but recently filled dilaudid and oxycodone on 3/20. No indication of benzo withdrawal. No indication of acute neuro emergency. f/u with PCP and pain specialist.   Final Clinical Impressions(s) / ED Diagnoses   Final diagnoses:  Upper abdominal pain  Acute UTI  Paroxetine withdrawal syndrome with perceptual disturbance [facility])    New Prescriptions Discharge Medication List as of 11/19/2016  1:29 PM    START taking these medications   Details  cephALEXin (KEFLEX) 500 MG capsule Take 1 capsule (500 mg total) by mouth 2 (two) times daily., Starting Mon 11/19/2016, Until Sat 11/24/2016, Print    ondansetron (ZOFRAN ODT) 4 MG disintegrating tablet Take 1 tablet (4 mg total) by mouth every 8 (eight) hours as needed for nausea or vomiting., Starting Mon 11/19/2016, Print    pantoprazole (PROTONIX) 20 MG tablet Take 1 tablet (20 mg total) by mouth daily., Starting Mon 11/19/2016, Print    PARoxetine  (PAXIL) 30 MG tablet Take 1 tablet (30 mg total) by mouth daily., Starting Mon 11/19/2016, Print         Pricilla Loveless, MD 11/19/16 1900

## 2016-11-19 NOTE — ED Notes (Signed)
Pt states she stopped taking all of her medication due to a weird taste in her mouth and a burnt smell in her nose. Pt stopped taking Paxil cold [place] and states her body is "just rejecting everything". Dr. Criss Alvine informed. Pt also states she can't feel her body and feels like she is getting zapped in her brain.

## 2016-11-19 NOTE — ED Triage Notes (Signed)
Pt states she has had withdrawal symptoms from medications for 2 months. She was taking pain medication (oxycodone) and paxil. She states she is no longer taking these X2 months but feels as though she has pins and needles as well as neck pain.

## 2016-11-19 NOTE — ED Notes (Signed)
Pt returned from US, placed back on monitor.

## 2016-11-20 LAB — URINE CULTURE: Culture: <10000 — AB

## 2016-11-27 ENCOUNTER — Ambulatory Visit (INDEPENDENT_AMBULATORY_CARE_PROVIDER_SITE_OTHER): Payer: Medicare Other | Admitting: Neurology

## 2016-11-27 ENCOUNTER — Encounter: Payer: Self-pay | Admitting: Neurology

## 2016-11-27 VITALS — BP 139/85 | HR 99 | Ht 67.0 in | Wt 192.8 lb

## 2016-11-27 DIAGNOSIS — R251 Tremor, unspecified: Secondary | ICD-10-CM

## 2016-11-27 DIAGNOSIS — R519 Headache, unspecified: Secondary | ICD-10-CM

## 2016-11-27 DIAGNOSIS — R200 Anesthesia of skin: Secondary | ICD-10-CM | POA: Diagnosis not present

## 2016-11-27 DIAGNOSIS — R51 Headache: Secondary | ICD-10-CM | POA: Diagnosis not present

## 2016-11-27 DIAGNOSIS — M791 Myalgia, unspecified site: Secondary | ICD-10-CM

## 2016-11-27 DIAGNOSIS — R531 Weakness: Secondary | ICD-10-CM

## 2016-11-27 DIAGNOSIS — G8929 Other chronic pain: Secondary | ICD-10-CM

## 2016-11-27 DIAGNOSIS — H539 Unspecified visual disturbance: Secondary | ICD-10-CM

## 2016-11-27 NOTE — Progress Notes (Signed)
[id number]    Provider:  Dr Lucia Gaskins Referring Provider: Ignatius Specking, MD Primary Care Physician:  Ignatius Specking, MD  CC:  Multiple symptoms.   HPI:  Latoya Herring is a 66 y.o. female here as a referral from Dr. Sherril Croon for headaches. 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 Here with husband who povides much information. She has been in pain management for 15 years. She was on Xtampza and she has had side effects. She has had headaches for years. She has had 5 papilloma's removed form her ethmoid. The last 2 months her ears are screaming, her entire body is numb from head to toe but mainly the left side, her feet are like ice. She has a "screaming, shrill noise in her head". Headaches are on the left side of the head, sharp pain on the left side of the head, she is going down hill every day, she feels like no one can hear her. There is shrill noise ongoing on the left side of the head. It is sharo and runs on the left eye. Throbbing. She has light sensitivity, Vision is being affected. Vision is getting blurry. Her spine is hot all up and down her spine. It is all the way down. She doesn;t talk, she does not want to talk, everything is shrill, she is hearing screaming in her head. Her ear drums are sensitive. She can't lay still. She is off of her medications. Her body won;t take it it turns her face red as a beat. For over 2 months she has not taken her medications and thatis when all this started. When she takes any medication her throat starts burning on fire and she can taste the medication in her mouth. She feels likes she has burning all over her body.She is not eating. Husband is here and provides much information.  Reviewed notes, labs and imaging from outside physicians, which showed:  Patient is seen by Dr. Thyra Breed in pain management.   Labs: 10/30/2016:  Cbc with elevated plts  386  Reviewed report CT head 11/2016:   FINDINGS:  .Calvarium/skull base: No evidence of fracture or destructive lesion. Mastoids and middle ears grossly clear. .Paranasal sinuses: Imaged portions clear. .Brain: No significant white matter disease. No evidence of acute ischemia. No mass effect, mass lesion, acute hemorrhage, or hydrocephalus.     Review of Systems: Patient complains of symptoms per HPI as well as the following symptoms: No CP, no SOB. Pertinent negatives per HPI. All others negative.   Social History   Social History  . Marital status: Married    Spouse name: N/A  . Number of children: 0  . Years of education: 12   Occupational History  . Disabled    Social History Main Topics  . Smoking status: Never Smoker  . Smokeless tobacco: Never Used  . Alcohol use No  . Drug use: Unknown  . Sexual activity: Not on file   Other Topics Concern  . Not on file   Social History Narrative   Lives at home w/ her husband   Right-handed   Caffeine: rare    Family History  Problem Relation Age of Onset  . Cancer Mother   . Other Father     Blood disease  . Neuropathy Neg Hx     Past Medical History:  Diagnosis Date  . Chronic neck and back pain   .  Fibromyalgia     Past Surgical History:  Procedure Laterality Date  . BACK SURGERY    . KNEE SURGERY Left    x 2   . SHOULDER SURGERY Left    x 2    No current outpatient prescriptions on file.   No current facility-administered medications for this visit.     Allergies as of 11/27/2016 - Review Complete 11/27/2016  Allergen Reaction Noted  . Penicillins Other (See Comments) 11/19/2016    Vitals: BP 139/85   Pulse 99   Ht  (1.702 m)   Wt 192 lb 12.8 oz (87.5 kg)   BMI 30.20 kg/m  Last Weight:  Wt Readings from Last 1 Encounters:  11/27/16 192 lb 12.8 oz (87.5 kg)   Last Height:   Ht Readings from Last 1 Encounters:  11/27/16  (1.702 m)   Physical exam: Exam: Gen:  NAD, conversant, well nourised, obese, well groomed                     CV: RRR, no MRG. No Carotid Bruits. No peripheral edema, warm, nontender Eyes: Conjunctivae clear without exudates or hemorrhage  Neuro: Detailed Neurologic Exam  Speech:    Speech is normal; fluent and spontaneous with normal comprehension.  Cognition:    The patient is oriented to person, place, and time;     recent and remote memory intact;     language fluent;     normal attention, concentration,     fund of knowledge Cranial Nerves:    The pupils are equal, round, and reactive to light. The fundi are normal and spontaneous venous pulsations are present. Visual fields are full to finger confrontation. Extraocular movements are intact. Trigeminal sensation is intact and the muscles of mastication are normal. The face is symmetric. The palate elevates in the midline. Hearing intact. Voice is normal. Shoulder shrug is normal. The tongue has normal motion without fasciculations.   Coordination:    Normal finger to nose and heel to shin. Normal rapid alternating movements.   Gait:    Antalgic  Motor Observation:    No asymmetry, no atrophy, and no involuntary movements noted. Tone:    Normal muscle tone.    Posture:    Posture is normal. normal erect    Strength:Mild bilat hip flexion and right biceps femoris weakness otherwise strength is V/V in the upper and lower limbs.      Sensation: Dec senstion all over her body to pin prick     Reflex Exam:  DTR's:    Deep tendon reflexes in the upper and lower extremities are brisk bilaterally.   Toes:    The toes are downgoing bilaterally.   Clonus:    Clonus is absent.   Assessment/Plan:  66 y.o. female here as a referral from Dr. Sherril Croon for headaches. 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 Here with husband who povides much information. She complains of multiple  neurologic symptoms: chronic pain (in pain management), recently stopped all her medication because her body is "rejecting it",  her ears are "screaming", her entire body is numb from head to toe, she has a "screaming, shrill noise in her head", she is declining every day, she has left-sided headaches. All of this started after she stopped all her medications.  Will order labs today as well as MRI of the brain She need to go back to her pain  doctor or primary care for medication management I cannot think of a unifying neurologic diagnosis for all her complaints and feel there is a prominent psychiatric component. I recommended psychiatry consult.   Discussed: To prevent or relieve headaches, try the following: Cool Compress. Lie down and place a cool compress on your head.  Avoid headache triggers. If certain foods or odors seem to have triggered your migraines in the past, avoid them. A headache diary might help you identify triggers.  Include physical activity in your daily routine. Try a daily walk or other moderate aerobic exercise.  Manage stress. Find healthy ways to cope with the stressors, such as delegating tasks on your to-do list.  Practice relaxation techniques. Try deep breathing, yoga, massage and visualization.  Eat regularly. Eating regularly scheduled meals and maintaining a healthy diet might help prevent headaches. Also, drink plenty of fluids.  Follow a regular sleep schedule. Sleep deprivation might contribute to headaches Consider biofeedback. With this mind-body technique, you learn to control certain bodily functions - such as muscle tension, heart rate and blood pressure - to prevent headaches or reduce headache pain.    Proceed to emergency room if you experience new or worsening symptoms or symptoms do not resolve, if you have new neurologic symptoms or if headache is severe, or for any concerning symptom.   Orders Placed This Encounter  Procedures  . MR BRAIN WO  CONTRAST  . TSH  . Sedimentation rate  . B12 and Folate Panel  . RPR  . Heavy metals, blood  . Synetta Shadow Antibody    Cc: Ignatius Specking, MD  Naomie Dean, MD  [facility] [address] [place], [place] [address]  Phone [phone] Fax [phone]

## 2016-11-27 NOTE — Patient Instructions (Signed)
Remember to drink plenty of fluid, eat healthy meals and do not skip any meals. Try to eat protein with a every meal and eat a healthy snack such as fruit or nuts in between meals. Try to keep a regular sleep-wake schedule and try to exercise daily, particularly in the form of walking, 20-30 minutes a day, if you can.   As far as diagnostic testing: Labs  Our phone number is [phone]. We also have an after hours call service for urgent matters and there is a physician on-call for urgent questions. For any emergencies you know to call 911 or go to the nearest emergency room   

## 2016-11-28 LAB — HEAVY METALS, BLOOD
Arsenic: 5 ug/L (ref 2–23)
Lead, Blood: NOT DETECTED ug/dL (ref 0–19)
Mercury: NOT DETECTED ug/L (ref 0.0–14.9)

## 2016-11-28 LAB — RPR: RPR Ser Ql: NONREACTIVE

## 2016-11-28 LAB — SEDIMENTATION RATE: SED RATE: 3 mm/h (ref 0–40)

## 2016-11-28 LAB — TSH: TSH: 1.65 u[IU]/mL (ref 0.450–4.500)

## 2016-11-28 LAB — B12 AND FOLATE PANEL: VITAMIN B 12: 623 pg/mL (ref 232–1245)

## 2016-11-28 LAB — B. BURGDORFI ANTIBODIES

## 2016-11-29 ENCOUNTER — Telehealth: Payer: Self-pay | Admitting: *Deleted

## 2016-11-29 ENCOUNTER — Encounter: Payer: Self-pay | Admitting: Neurology

## 2016-11-29 NOTE — Telephone Encounter (Signed)
Per Dr Lucia Gaskins, spoke with patient and informed her that her labs are normal. Patient's husband got on the phone and stated "she's going down. I had to pick her up off the floor. She is worse." This RN advised he take her to the hospital if her condition is rapidly decling. Husband stated he has taken her everywhere in the past two weeks. He stated the medication has poisoned her. This RN stated that he needs to take her to the hospital if she is declining. Husband stated "they don't do anything". Again this RN advised he take her to a hospital, suggested [place], [place], [place], [place]. Repeated this strong recommendation.  Husband stated understanding and ended the call.

## 2017-01-03 ENCOUNTER — Telehealth: Payer: Self-pay | Admitting: Neurology

## 2017-01-03 NOTE — Telephone Encounter (Signed)
Returned call to pt's husband. Spoke to pt who said that no one ever contacted her to schedule the MRI. Reports that she's having numbness/tingling and vision changes. Gave pt contact info for [facility] (pt's preference) and she agreed to call tomorrow to schedule. No pre-cert required per MRI coordinator. Also re-faxed order to [facility] as well.  Scheduling: [phone] (option 2) Scheduling fax: [phone]   Will call pt on Monday to schedule follow-up appt after MRI is scheduled so that results may be available for review. Pt verbalized understanding and appreciation for call.

## 2017-01-03 NOTE — Telephone Encounter (Signed)
Her insurance does not require authorization.. And I [facility].. But we have had some problems with them receiving some of my orders and they are working on the problem.

## 2017-01-03 NOTE — Telephone Encounter (Signed)
Pt's husband called said the patient is experiencing hands and fingers tingling with drawing of the entire face. He said she just saw PCP/[facility] and was advised she f/u with neurologist. Pt's husband is wanting her to see Dr Vickey Hugerohmeier. He said the patient has seen Dr Vickey Hugerohmeier in the past.

## 2017-01-03 NOTE — Telephone Encounter (Signed)
Give her a follow up with Eber Jonesarolyn please thanks

## 2017-01-07 NOTE — Telephone Encounter (Signed)
This is primarily a psychiatric disorder. MRI will be done this Wednesday. In the meantime she should visit the ED with anything acute and also see her primary care.

## 2017-01-07 NOTE — Telephone Encounter (Signed)
Pt said her MRI is Wednesday at 4:45. Please call

## 2017-01-07 NOTE — Telephone Encounter (Signed)
Spoke to pt and she's willing to see either Dr. Lucia GaskinsAhern or Eber Jonesarolyn after MRI.

## 2017-01-07 NOTE — Telephone Encounter (Signed)
Returned call and spoke to both pt and her husband who said that pt cannot do MRI. Reports that she "can't be still, will just be sitting in a dark room rocking until she can see a neurologist." Offered to send in rx for anti-anxiety medication prior to MRI but pt still refused to have procedure. Advised to contact PCP or go to ER d/t worsening symptoms but they again refused. Husband says that he couldn't stand to be in the same room as PCP and that she's already been to ER at several hospitals multiple times. Asked about Dr. Trevor MaceAhern's recommendations for psychiatric consult and they said that she saw psych a couple of weeks ago who told her that "she didn't need psych but needed neurology." No notes found for psych visit but pt was [facility] 12/[facility]16 for "major depression, anxiety, and chronic pain of unknown origin who [facility] after an intentional overdose on an unknown quantity of Xanax secondary to psychosocial stressors and c/o of chronic pain." She has an upcoming appt scheduled w/ Dr. Donell BeersPlovsky in July. Offered to give pt/husband contact information for [facility] (Dr. Caprice RenshawPlovsky's office) so that they can call and request sooner appt if available. They again said no and then asked for appt w/ Dr. Vickey Hugerohmeier (who pt has supposedly seen in the past, not in [facility] but maybe in [facility]?) saying that they no longer want to see Dr. Lucia GaskinsAhern.

## 2017-01-08 NOTE — Telephone Encounter (Signed)
Latoya Herring, please see below. Dr. Vickey Hugerohmeier declines appt with patient. Patient does not want to see me anymore. Patient should see her pcp for referral to another neurology center. Would one of you call patient's husband? Thank you

## 2017-01-08 NOTE — Telephone Encounter (Signed)
Not able to see patient , no transfer of care.  Please offer her transfer to another care facility if they are not willing to see dr Lucia Gaskinsahern.

## 2017-01-09 NOTE — Telephone Encounter (Signed)
Unfortunately I do not have anymore to offer patient. I believe this is psychiatric and she should follow up with Dr. Donell BeersPlovsky. thanks

## 2017-01-09 NOTE — Telephone Encounter (Signed)
Spoke with patient. Explained that Dr. Lucia GaskinsAhern could not offer her anything else and advised she follow-up with Dr. Donell BeersPlovsky. She said she would but wanted to know if she had MRI would you see her. I explained that could not offer anything else to help her but she wanted me to ask anyway.

## 2017-01-10 NOTE — Telephone Encounter (Signed)
I have nothing else to offer her thanks

## 2017-01-16 ENCOUNTER — Ambulatory Visit (INDEPENDENT_AMBULATORY_CARE_PROVIDER_SITE_OTHER): Payer: 59 | Admitting: Psychiatry

## 2017-01-16 ENCOUNTER — Encounter (INDEPENDENT_AMBULATORY_CARE_PROVIDER_SITE_OTHER): Payer: Self-pay

## 2017-01-16 ENCOUNTER — Ambulatory Visit (HOSPITAL_COMMUNITY): Payer: Medicare Other | Admitting: Psychiatry

## 2017-01-16 ENCOUNTER — Encounter (HOSPITAL_COMMUNITY): Payer: Self-pay | Admitting: Psychiatry

## 2017-01-16 VITALS — BP 132/80 | HR 101 | Ht 67.0 in | Wt 178.6 lb

## 2017-01-16 DIAGNOSIS — F320 Major depressive disorder, single episode, mild: Secondary | ICD-10-CM

## 2017-01-16 MED ORDER — MIRTAZAPINE 30 MG PO TABS: ORAL_TABLET | ORAL | 4 refills | Status: AC

## 2017-01-16 NOTE — Progress Notes (Signed)
Psychiatric Initial Adult Assessment   Patient Identification: Latoya Herring MRN:  [id number] Date of Evaluation:  01/16/2017 [facility] Source[facility]   [facility] Chief Complaint:   Visit Diagnosis: No diagnosis found.  History of Present Illness: This patient is a 66 year old white married female was psychiatrically hospitalized for 1 week is been out of the hospital for one month. In January 2019 the patient was completely well. She had no psychiatric history and was doing great. Unfortunately her pain doctor had changed her opiate medications to a new opiate medicines which did not work very well. That it made her feel very strangely. It they'll she had progressive pain and a lot of straining sensation. Unfortunately the only reason she needed to change his because insurance company would not favor peroxide,. It is noted associated history of being on very low-dose of Xanax the patient was so distressed with the change in her medications that she infected impulsively took an overdose of Xanax. Normally she was taking Xanax 0.5 mg once a day. She has an extra Xanax and overdose. She's never made an overdose attempt before. She never been in a psychiatric hospital. The patient denies being depressed at that time. At this time she admits to persistent daily depression and anhedonia. At this time she is sleeping very poorly. She's lost 35 pounds in the last 3 months. Her energy level is very low. She is able to think and concentrate without problems. She denies worthlessness. She denies being suicidal at this time. The patient's been married for 32 years in a stable marriage. She is no children the patient denies the use of alcohol or drugs. She denies any symptoms of psychosis now or ever. The patient denies a distinct episode of major depression. She denies manic symptoms. She denies any anxiety conditions such as generalized anxiety disorder, panic disorder or OCD. Her medical  illnesses include back surgery. Presently she is on disability for chronic back pain. The patient describes her experience right now is a state of feeling like she cannot heel herself. 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 She said her and requiring the dark. Up until this recent psychiatric hospitalization she had been in a psychiatric hospital. She never seen a psychiatrist. She's never been in therapy. Apparently they did discharge her on medication from the hospital but she's too scared to take them. They discharged her on Lexapro and Seroquel. He also gave her*25 mg 2 a day when necessary. She's not taking any medications at all at this time.  Associated Signs/Symptoms: Depression Symptoms:  depressed mood, (Hypo) Manic Symptoms:  Distractibility, Anxiety Symptoms:  Excessive Worry, Psychotic Symptoms:   PTSD Symptoms:   Past Psychiatric History:   Previous Psychotropic Medications: No   Substance Abuse History in the last 12 months:  No.  Consequences of Substance Abuse:   Past Medical History:  Past Medical History:  Diagnosis Date  . Chronic neck and back pain   . Fibromyalgia     Past Surgical History:  Procedure Laterality Date  . BACK SURGERY    . KNEE SURGERY Left    x 2   . palpalomas    . SHOULDER SURGERY Left    x 2    Family Psychiatric History:   Family History:  Family History  Problem Relation Age of Onset  . Cancer Mother   . Other Father        Blood disease  . Neuropathy Neg Hx  Social History:   Social History   Social History  . Marital status: Married    Spouse name: N/A  . Number of children: 0  . Years of education: 12   Occupational History  . Disabled    Social History Main Topics  . Smoking status: Never Smoker  . Smokeless tobacco: Never Used  . Alcohol use No  . Drug use: No  . Sexual activity: Not Currently   Other Topics Concern  . None   Social History Narrative   Lives at home w/ her husband    Right-handed   Caffeine: rare    Additional Social History:   Allergies:   Allergies  Allergen Reactions  . Penicillins Other (See Comments)    Always made worse  Has patient had a PCN reaction causing immediate rash, facial/tongue/throat swelling, SOB or lightheadedness with hypotension: No Has patient had a PCN reaction causing severe rash involving mucus membranes or skin necrosis: No Has patient had a PCN reaction that required hospitalization No Has patient had a PCN reaction occurring within the last 10 years: No If all of the above answers are "NO", then may proceed with Cephalosporin use.     Metabolic Disorder Labs: No results found for: HGBA1C, MPG No results found for: PROLACTIN No results found for: CHOL, TRIG, HDL, CHOLHDL, VLDL, LDLCALC   Current Medications: No current outpatient prescriptions on file.   No current facility-administered medications for this visit.     Neurologic: Headache: No Seizure: No Paresthesias:No  Musculoskeletal: Strength & Muscle Tone: within normal limits Gait & Station: normal Patient leans: Left  Psychiatric Specialty Exam: ROS  Blood pressure 132/80, pulse (!) 101, height 5\' 7"  (1.702 m), weight 178 lb 9.6 oz (81 kg).Body mass index is 27.97 kg/m.  General Appearance: Casual  Eye Contact:  Fair  Speech:  Clear and Coherent  Volume:  Normal  Mood:  Anxious  Affect:  Blunt  Thought Process:  Coherent  Orientation:  NA  Thought Content:  WDL  Suicidal Thoughts:  No  Homicidal Thoughts:  No  Memory:  NA  Judgement:  Poor  Insight:  Lacking  Psychomotor Activity:  Increased  Concentration:    Recall:    Fund of Knowledge:Fair  Language: NA  Akathisia:  No  Handed:  Right  AIMS (if indicated):    Assets:    ADL's:  Impaired  Cognition: WNL  Sleep:      Treatment Plan Summary: At this time is patient is a complication presentation. She is under the care of Dr Thyra Breed. Dr. Jamesetta So back to small dose  severe. He given that it did make a difference she's having a lot of strange sensations. She is unable to describe exactly being depressed very very distressed. She is clearly anhedonic. She's having disrupted sleep has lost 40 pounds. She's not suicidal at this time I interventions are reviewed her Remeron 30 mg. Ultimately I likely were low dose of Klonopin that she is too anxious to worry about any medications. I'm concerned about her compliance. I do not think she suicidal. Her husband was in the room the entire time or energy. See these committed that she will take her medicines and return to see me in about 3 or 4. I do not believe the patient is dangerous to herself or to others. Is my hope that the Remeron concert down helps her sleep and eat.    Gypsy Balsam, MD 5/30/20183:34 PM

## 2017-02-15 ENCOUNTER — Ambulatory Visit (HOSPITAL_COMMUNITY): Payer: 59 | Admitting: Psychiatry

## 2017-03-13 ENCOUNTER — Ambulatory Visit (HOSPITAL_COMMUNITY): Payer: Medicare Other | Admitting: Psychiatry

## 2017-04-17 ENCOUNTER — Ambulatory Visit (HOSPITAL_COMMUNITY): Payer: Medicare Other | Admitting: Psychiatry

## 2017-05-14 IMAGING — US US ABDOMEN LIMITED
1 series · 14 of 25 positions shown · non-contrast
Comparison: None.

CLINICAL DATA: Right upper quadrant pain

EXAM:
US ABDOMEN LIMITED - RIGHT UPPER QUADRANT

[Series 1: us abdomen limited · 0.24mm/px · 14 of 27 slices shown]
[im 1/27]
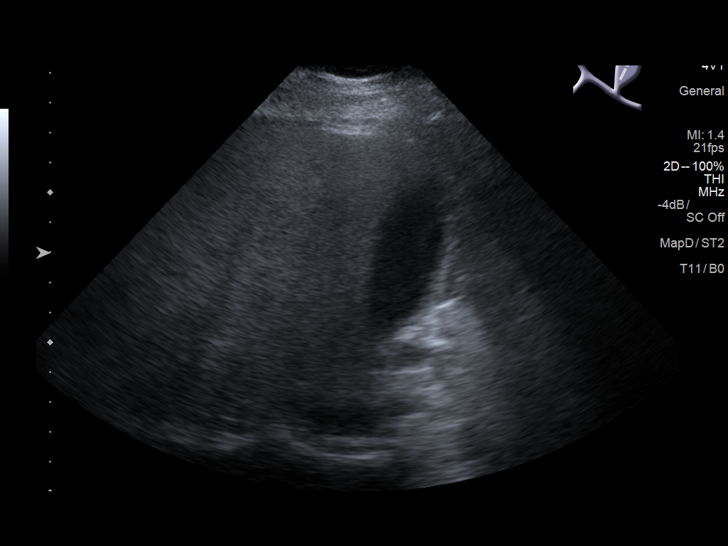
[im 3/27]
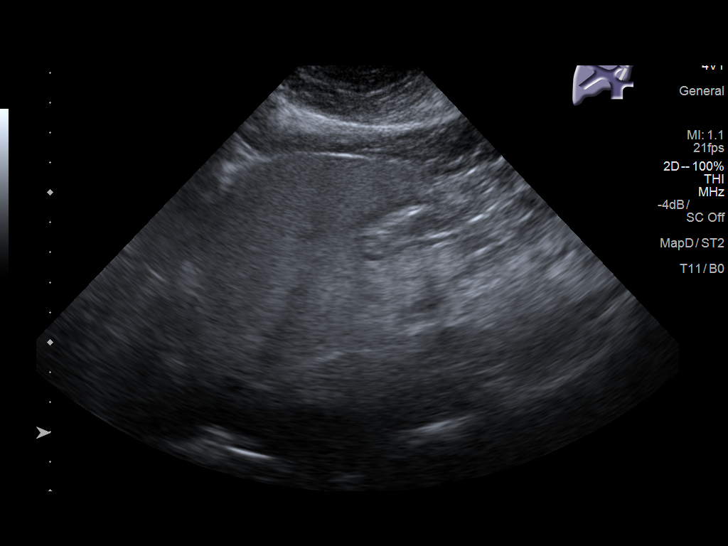
[im 5/27]
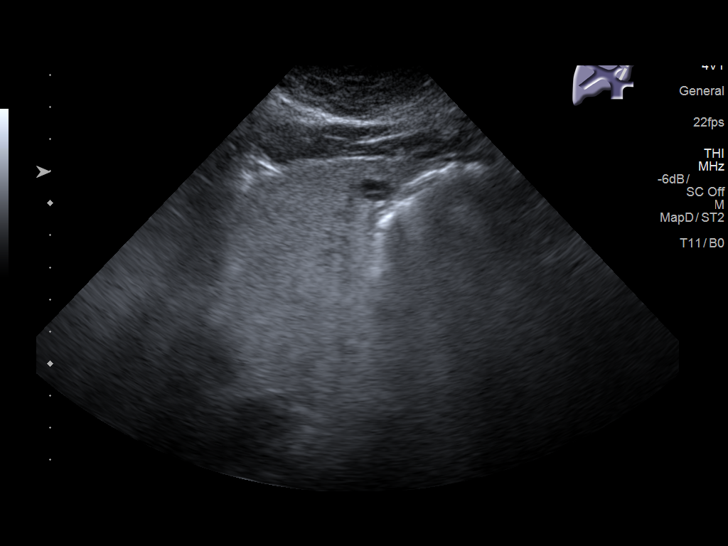
[im 7/27]
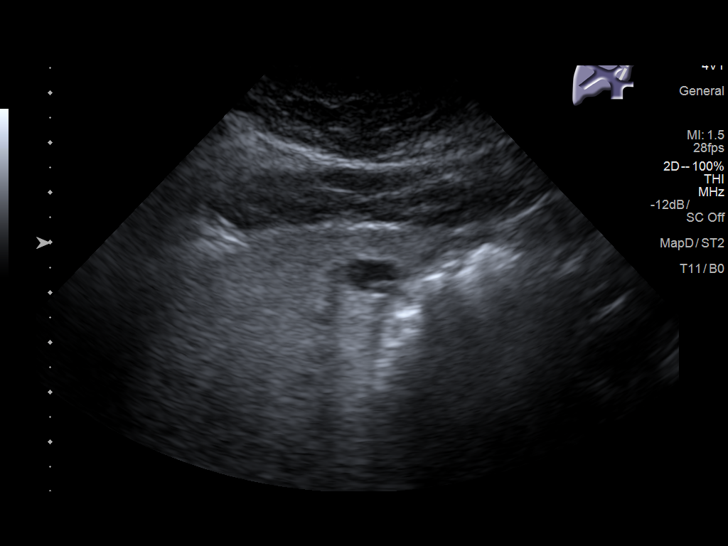
[im 9/27]
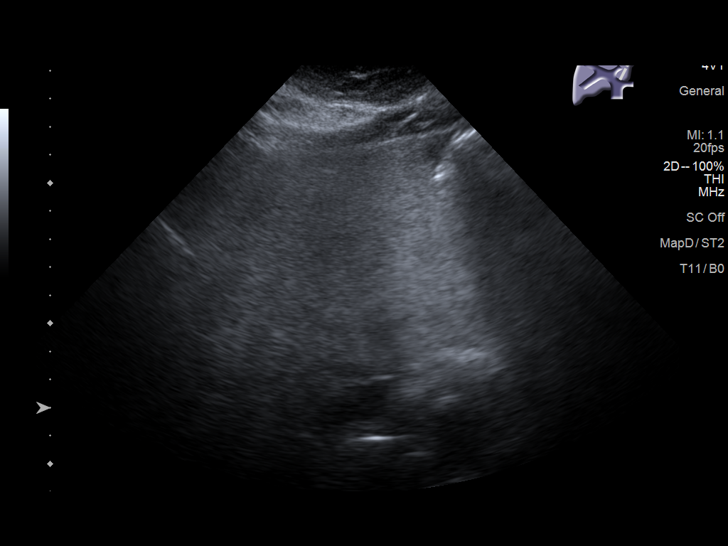
[im 10/27]
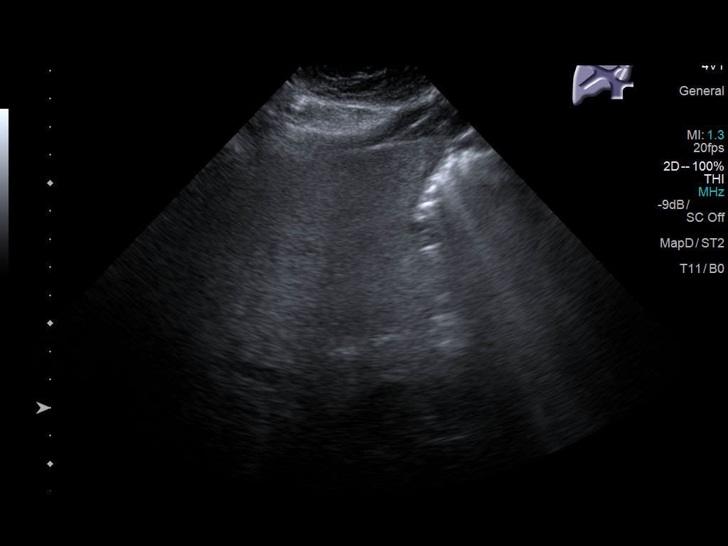
[im 12/27]
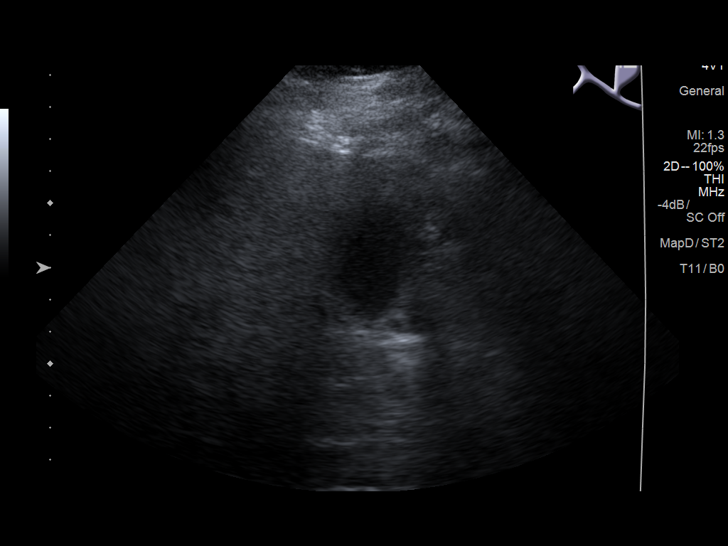
[im 15/27]
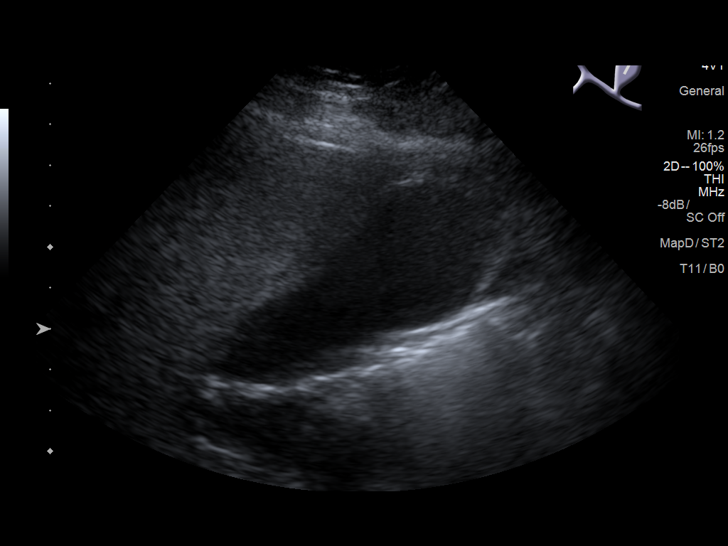
[im 17/27]
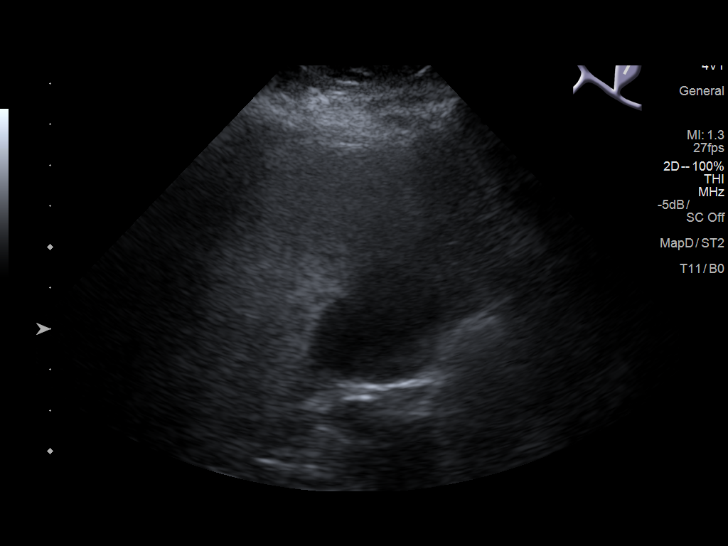
[im 18/27]
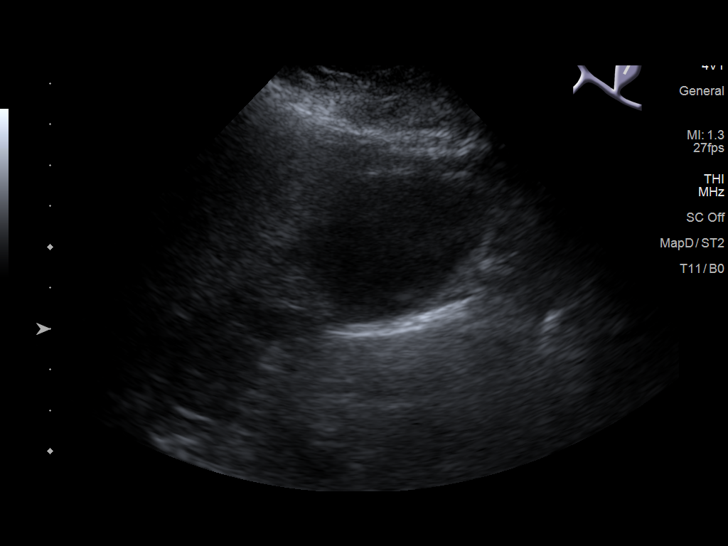
[im 20/27]
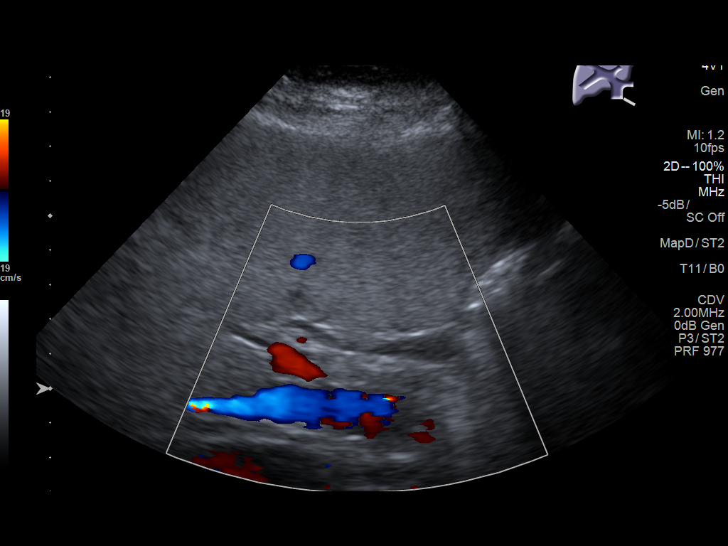
[im 22/27]
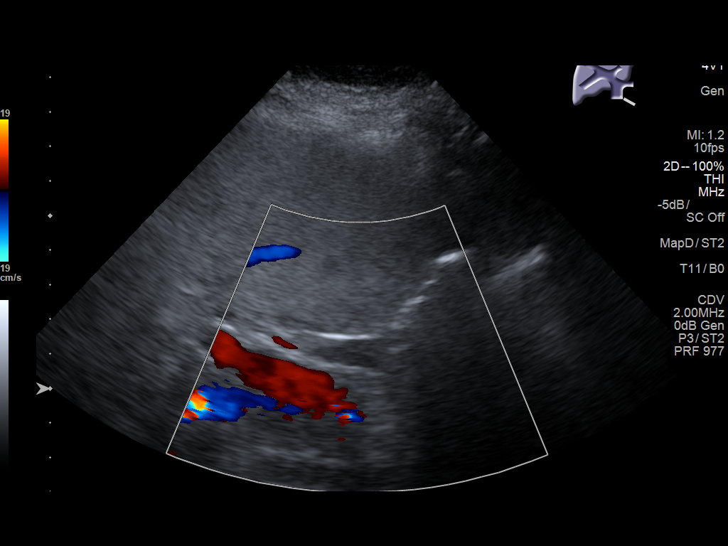
[im 24/27]
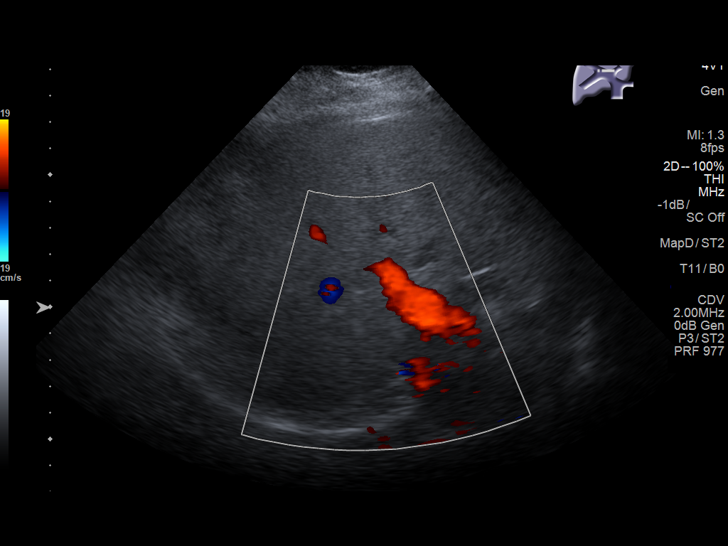
[im 27/27]
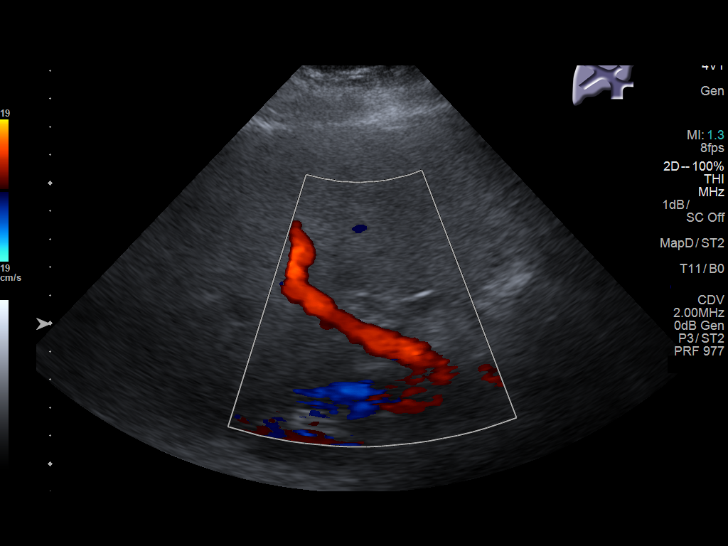

[14 of 25 positions shown; findings below may reference images not displayed]

FINDINGS: Gallbladder:

No gallstones or wall thickening visualized. No sonographic Murphy
sign noted by sonographer.

Common bile duct:

Diameter: Focal CBD dilatation measuring 9 mm with the overall CBD
measuring 3 mm.

Liver:

10 x 8 x 12 mm hypoechoic mass with increased through transmission
in the left hepatic lobe likely reflecting a small cyst. Within
normal limits in parenchymal echogenicity.
IMPRESSION: No cholelithiasis or sonographic evidence of acute cholecystitis.

## 2023-12-24 DIAGNOSIS — I10 Essential (primary) hypertension: Secondary | ICD-10-CM | POA: Diagnosis not present

## 2023-12-24 DIAGNOSIS — M542 Cervicalgia: Secondary | ICD-10-CM | POA: Diagnosis not present

## 2023-12-24 DIAGNOSIS — N3000 Acute cystitis without hematuria: Secondary | ICD-10-CM | POA: Diagnosis not present

## 2024-02-12 DIAGNOSIS — J029 Acute pharyngitis, unspecified: Secondary | ICD-10-CM | POA: Diagnosis not present

## 2024-02-12 DIAGNOSIS — R030 Elevated blood-pressure reading, without diagnosis of hypertension: Secondary | ICD-10-CM | POA: Diagnosis not present

## 2024-03-28 DIAGNOSIS — R059 Cough, unspecified: Secondary | ICD-10-CM | POA: Diagnosis not present

## 2024-03-28 DIAGNOSIS — J029 Acute pharyngitis, unspecified: Secondary | ICD-10-CM | POA: Diagnosis not present

## 2024-03-28 DIAGNOSIS — R197 Diarrhea, unspecified: Secondary | ICD-10-CM | POA: Diagnosis not present
